# Patient Record
Sex: Female | Born: 1980 | Race: Black or African American | Hispanic: No | Marital: Single | Smoking: Current every day smoker
Health system: Southern US, Community
[De-identification: ages and names within clinical notes are randomized; demographics above are authoritative.]

---

## 2016-06-28 ENCOUNTER — Emergency Department (HOSPITAL_BASED_OUTPATIENT_CLINIC_OR_DEPARTMENT_OTHER): Payer: Medicaid Other

## 2016-06-28 ENCOUNTER — Encounter (HOSPITAL_BASED_OUTPATIENT_CLINIC_OR_DEPARTMENT_OTHER): Payer: Self-pay | Admitting: *Deleted

## 2016-06-28 ENCOUNTER — Emergency Department (HOSPITAL_BASED_OUTPATIENT_CLINIC_OR_DEPARTMENT_OTHER)
Admission: EM | Admit: 2016-06-28 | Discharge: 2016-06-28 | Disposition: A | Discharge status: Home / Self Care | Payer: Medicaid Other | Source: Home / Self Care | Attending: Emergency Medicine | Admitting: Emergency Medicine

## 2016-06-28 ENCOUNTER — Emergency Department (HOSPITAL_BASED_OUTPATIENT_CLINIC_OR_DEPARTMENT_OTHER)
Admission: EM | Admit: 2016-06-28 | Discharge: 2016-06-28 | Disposition: A | Discharge status: Home / Self Care | Payer: Medicaid Other | Attending: Emergency Medicine | Admitting: Emergency Medicine

## 2016-06-28 DIAGNOSIS — Z3A00 Weeks of gestation of pregnancy not specified: Secondary | ICD-10-CM

## 2016-06-28 DIAGNOSIS — R109 Unspecified abdominal pain: Secondary | ICD-10-CM | POA: Diagnosis not present

## 2016-06-28 DIAGNOSIS — O26899 Other specified pregnancy related conditions, unspecified trimester: Secondary | ICD-10-CM

## 2016-06-28 DIAGNOSIS — Z3A01 Less than 8 weeks gestation of pregnancy: Secondary | ICD-10-CM | POA: Insufficient documentation

## 2016-06-28 DIAGNOSIS — F17200 Nicotine dependence, unspecified, uncomplicated: Secondary | ICD-10-CM | POA: Insufficient documentation

## 2016-06-28 DIAGNOSIS — O23511 Infections of cervix in pregnancy, first trimester: Secondary | ICD-10-CM | POA: Insufficient documentation

## 2016-06-28 DIAGNOSIS — N72 Inflammatory disease of cervix uteri: Secondary | ICD-10-CM

## 2016-06-28 DIAGNOSIS — R102 Pelvic and perineal pain: Secondary | ICD-10-CM | POA: Insufficient documentation

## 2016-06-28 DIAGNOSIS — O23591 Infection of other part of genital tract in pregnancy, first trimester: Secondary | ICD-10-CM | POA: Insufficient documentation

## 2016-06-28 DIAGNOSIS — R197 Diarrhea, unspecified: Secondary | ICD-10-CM

## 2016-06-28 DIAGNOSIS — N760 Acute vaginitis: Secondary | ICD-10-CM

## 2016-06-28 DIAGNOSIS — B9689 Other specified bacterial agents as the cause of diseases classified elsewhere: Secondary | ICD-10-CM

## 2016-06-28 DIAGNOSIS — O26891 Other specified pregnancy related conditions, first trimester: Secondary | ICD-10-CM

## 2016-06-28 DIAGNOSIS — Z3490 Encounter for supervision of normal pregnancy, unspecified, unspecified trimester: Secondary | ICD-10-CM

## 2016-06-28 LAB — URINALYSIS, ROUTINE W REFLEX MICROSCOPIC
Bilirubin Urine: NEGATIVE
GLUCOSE, UA: NEGATIVE mg/dL
Hgb urine dipstick: NEGATIVE
Ketones, ur: NEGATIVE mg/dL
LEUKOCYTES UA: NEGATIVE
NITRITE: NEGATIVE
PROTEIN: NEGATIVE mg/dL
Specific Gravity, Urine: 1.022 (ref 1.005–1.030)
pH: 6 (ref 5.0–8.0)

## 2016-06-28 LAB — WET PREP, GENITAL
Sperm: NONE SEEN
Trich, Wet Prep: NONE SEEN
Yeast Wet Prep HPF POC: NONE SEEN

## 2016-06-28 LAB — ABO/RH: ABO/RH(D): A POS

## 2016-06-28 LAB — PREGNANCY, URINE: PREG TEST UR: POSITIVE — AB

## 2016-06-28 LAB — HCG, QUANTITATIVE, PREGNANCY: HCG, BETA CHAIN, QUANT, S: 9189 m[IU]/mL — AB (ref <5)

## 2016-06-28 MED ORDER — AZITHROMYCIN 250 MG PO TABS
1000.0000 mg | ORAL_TABLET | Freq: Once | ORAL | Status: AC
  Administered 2016-06-28: 1000 mg via ORAL
  Filled 2016-06-28: qty 4

## 2016-06-28 MED ORDER — CEFTRIAXONE SODIUM 250 MG IJ SOLR
250.0000 mg | Freq: Once | INTRAMUSCULAR | Status: DC
  Filled 2016-06-28: qty 250

## 2016-06-28 MED ORDER — LIDOCAINE HCL (PF) 1 % IJ SOLN
INTRAMUSCULAR | Status: AC
  Filled 2016-06-28: qty 5

## 2016-06-28 MED ORDER — CEFTRIAXONE SODIUM 250 MG IJ SOLR
250.0000 mg | Freq: Once | INTRAMUSCULAR | Status: AC
  Administered 2016-06-28: 250 mg via INTRAMUSCULAR
  Filled 2016-06-28: qty 250

## 2016-06-28 MED ORDER — AZITHROMYCIN 250 MG PO TABS
1000.0000 mg | ORAL_TABLET | Freq: Once | ORAL | Status: DC
  Filled 2016-06-28: qty 4

## 2016-06-28 MED ORDER — LIDOCAINE HCL (PF) 1 % IJ SOLN
INTRAMUSCULAR | Status: AC
  Administered 2016-06-28: 2.1 mL
  Filled 2016-06-28: qty 5

## 2016-06-28 MED ORDER — METRONIDAZOLE 0.75 % VA GEL: 1.0000 | Freq: Two times a day (BID) | VAGINAL | 0 refills | Status: AC

## 2016-06-28 NOTE — ED Triage Notes (Signed)
States she just left due to having no one to pick up her child. She wants to continue her care.

## 2016-06-28 NOTE — ED Provider Notes (Signed)
[facility] Provider Note   CSN: [id number] Arrival date & time: 06/28/16  1824  By signing my name below, I, Katherine Clayton, attest that this documentation has been prepared under the direction and in the presence of Fayrene Helper PA-C.  Electronically Signed: Vista Clayton, ED Scribe. 06/28/16. 7:01 PM.   History   Chief Complaint Chief Complaint  Patient presents with  . Vaginal Bleeding    HPI HPI Comments: Katherine Clayton is a 35 y.o. Female who is G4P2A2 who presents to the Emergency Department complaining of intermittent cramping abdominal pain, onset one week ago. Pt started having vaginal discharge three weeks ago and was seen by her PCP who dx her with bacterial vaginosis and a yeast infection, treated with Flagyl and Fluconazole. These symptoms subsided with treatment. Two weeks ago, she was supposed to start her menstrual period but never started. Pt then took an at home pregnancy test a few days after and was positive. This is her 4th pregnancy, has two children currently. One week after taking the pregnancy test, she started to have the abdominal pain. She also reports associated vaginal bleeding described as "like I'm spotting". Pt noticed this intermittently on toilet tissue when wiping. She also reports one episode of diarrhea yesterday, no hematochezia. Pt states that she has had 2 different sexual partners in the past 6 months. She denies any modifying factors to her abdominal pain. Pt denies dysuria, hematuria, fever, chills, nausea, vomiting.   The history is provided by the patient. No language interpreter was used.    History reviewed. No pertinent past medical history.  There are no active problems to display for this patient.   History reviewed. No pertinent surgical history.  OB History    No data available       Home Medications    Prior to Admission medications   Not on File    Family History No family history on file.  Social  History Social History  Substance Use Topics  . Smoking status: Current Every Day Smoker  . Smokeless tobacco: Never Used  . Alcohol use Yes     Allergies   Review of patient's allergies indicates no known allergies.   Review of Systems Review of Systems  Constitutional: Negative for chills and fever.  Gastrointestinal: Positive for abdominal pain (lower) and diarrhea (one episode). Negative for nausea and vomiting.  Genitourinary: Positive for vaginal bleeding. Negative for dysuria and hematuria.  All other systems reviewed and are negative.    Physical Exam Updated Vital Signs BP 115/66 (BP Location: Left Arm)   Pulse 66   Temp 98.1 F (36.7 C) (Oral)   Resp 16   Ht 5\' 9"  (1.753 m)   Wt 140 lb (63.5 kg)   LMP 05/13/2016   SpO2 100%   BMI 20.67 kg/m   Physical Exam  Constitutional: She is oriented to person, place, and time. She appears well-developed and well-nourished. No distress.  HENT:  Head: Normocephalic and atraumatic.  Neck: Normal range of motion.  Cardiovascular: Normal rate and regular rhythm.   Pulmonary/Chest: Effort normal and breath sounds normal. She has no wheezes.  Abdominal: There is tenderness. There is no rebound and no guarding.  Mild tenderness to left lower abdomen on palpation. No guarding or rebound on palpation.  Genitourinary:  Genitourinary Comments: Chaperone present during exam.  No inguinal lymphadenopathy or inguinal hernia noted.  Normal external genitalia.  Mild discomfort with speculum insertion.  Mild yellow discharge noted in vaginal vault.  Closed cervical os without lesion or rash.  On bimanual exam R adnexal tenderness without CMT.  No mass.    Neurological: She is alert and oriented to person, place, and time.  Skin: Skin is warm and dry. She is not diaphoretic.  Psychiatric: She has a normal mood and affect. Judgment normal.  Nursing note and vitals reviewed.   ED Treatments / Results  DIAGNOSTIC STUDIES: Oxygen  Saturation is 100% on RA, normal by my interpretation.  COORDINATION OF CARE: 6:55 PM-Will do pelvic exam. Discussed treatment plan with pt at bedside and pt agreed to plan.   Labs (all labs ordered are listed, but only abnormal results are displayed) Labs Reviewed  WET PREP, GENITAL - Abnormal; Notable for the following:       Result Value   Clue Cells Wet Prep HPF POC PRESENT (*)    WBC, Wet Prep HPF POC MANY (*)    All other components within normal limits  PREGNANCY, URINE - Abnormal; Notable for the following:    Preg Test, Ur POSITIVE (*)    All other components within normal limits  URINALYSIS, ROUTINE W REFLEX MICROSCOPIC (NOT AT [facility])  HCG, QUANTITATIVE, PREGNANCY  HIV ANTIBODY (ROUTINE TESTING)  RPR  ABO/RH  GC/CHLAMYDIA PROBE AMP ([place]) NOT AT [facility]    EKG  EKG Interpretation None       Radiology No results found.  Procedures Procedures (including critical care time)  Medications Ordered in ED Medications  cefTRIAXone (ROCEPHIN) injection 250 mg (not administered)  azithromycin (ZITHROMAX) tablet 1,000 mg (not administered)     Initial Impression / Assessment and Plan / ED Course  I have reviewed the triage vital signs and the nursing notes.  Pertinent labs & imaging results that were available during my care of the patient were reviewed by me and considered in my medical decision making (see chart for details).  Clinical Course    BP 115/66 (BP Location: Left Arm)   Pulse 66   Temp 98.1 F (36.7 C) (Oral)   Resp 16   Ht 5\' 9"  (1.753 m)   Wt 63.5 kg   LMP 05/13/2016   SpO2 100%   BMI 20.67 kg/m    Final Clinical Impressions(s) / ED Diagnoses   Final diagnoses:  Right adnexal tenderness  Pelvic pain in female    New Prescriptions New Prescriptions   No medications on file  I personally performed the services described in this documentation, which was scribed in my presence. The recorded information has been reviewed and is  accurate.     7:14 PM Pt here with lower pelvic pain, positive pregnancy test.  Does report mild vaginal bleeding and pain.  No visible bleeding noted on exam.  Does have vaginal discharge, therefore will treat for potential STD.  Will obtain transvaginal US to r/o ectopic, TOA, or torsion.  Will check ABO/Rh as well as Hcg Quant.  Pain medication offered, pt declined.  Pt is well appearing.    7:57 PM Wet prep shows clue cells and many WBC.  Will treat for cervicitis with rocephin/zithromax  Nurse informed me that pt have left AMA because she has to pick up her kid.  She understand that she can return for further care.     Fayrene HelperBowie Peggy Loge, PA-C 06/28/16 2000    Rolan BuccoMelanie Belfi, MD 06/28/16 2024

## 2016-06-28 NOTE — ED Triage Notes (Signed)
Vaginal bleeding. States she had a positive home pregnancy test. She thinks she has a vaginal infection.

## 2016-06-28 NOTE — ED Provider Notes (Signed)
[facility]   CSN: [id number] Arrival date & time: 06/28/16  2009  By signing my name below, I, Katherine Clayton, attest that this documentation has been prepared under the direction and in the presence of Katherine Clayton Katherine Biller PA-C. Electronically Signed: Vista Minkobert Clayton, ED Scribe. 06/28/16. 9:00 PM.  Charting resumed from Katherine Clayton, now Katherine Clayton at 9:29 PM.   By signing my name below, I, Katherine AdieWilliam Andrew Clayton, attest that this documentation has been prepared under the direction and in the presence of Katherine Clayton Blasa Raisch, PA-C.  Electronically Signed: Rosario AdieWilliam Andrew Clayton, ED Scribe. 06/28/16. 9:29 PM.  History   Chief Complaint Chief Complaint  Patient presents with  . Abdominal Pain   HPI HPI Comments: Katherine Clayton is a 35 y.o. Female who is G4P2A2 who presents to the Emergency Department complaining of intermittent cramping abdominal pain, onset one week ago. Pt started having vaginal discharge three weeks ago and was seen by her PCP who dx her with bacterial vaginosis and a yeast infection, treated with Flagyl and Fluconazole. These symptoms subsided with treatment. Two weeks ago, she was supposed to start her menstrual period but reports that it never started. Pt then took an at home pregnancy test a few days after and was positive. This is her 4th pregnancy, has two children currently. One week after taking the pregnancy test, the abdominal pain started. She also reports associated vaginal bleeding described as "like I'm spotting". Pt noticed this intermittently, on toilet tissue when wiping. She also reports one episode of diarrhea yesterday, no hematochezia. Pt states that she has had 2 different sexual partners in the past 6 months. She denies any modifying factors to her abdominal pain. Pt denies dysuria, hematuria, fever, chills, nausea, vomiting.   The history is provided by the patient. No language interpreter was used.    History reviewed. No pertinent past  medical history.  There are no active problems to display for this patient.   History reviewed. No pertinent surgical history.  OB History    No data available       Home Medications    Prior to Admission medications   Not on File    Family History No family history on file.  Social History Social History  Substance Use Topics  . Smoking status: Current Every Day Smoker  . Smokeless tobacco: Never Used  . Alcohol use Yes     Allergies   Review of patient's allergies indicates no known allergies.   Review of Systems Review of Systems   Physical Exam Updated Vital Signs BP 111/67 (BP Location: Left Arm)   Pulse 79   Temp 98.1 F (36.7 C) (Oral)   Resp 16   LMP 05/13/2016   SpO2 100%   Physical Exam  Constitutional: She is oriented to person, place, and time. She appears well-developed and well-nourished. No distress.  HENT:  Head: Normocephalic and atraumatic.  Neck: Normal range of motion.  Pulmonary/Chest: Effort normal.  Abdominal: Soft. Bowel sounds are normal. There is tenderness. There is no rebound and no guarding.  Mild tenderness to left lower abdomen on palpation. No guarding or rebound on palpation.   Genitourinary:  Genitourinary Comments: Chaperone present during exam.  No inguinal lymphadenopathy or inguinal hernia noted.  Normal external genitalia.  Mild discomfort with speculum insertion.  Mild yellow discharge noted in vaginal vault.  Closed cervical os without lesion or rash.  On bimanual exam R adnexal tenderness without CMT.  No mass.  Neurological: She is alert and oriented to person, place, and time.  Skin: Skin is warm and dry. She is not diaphoretic.  Psychiatric: She has a normal mood and affect. Judgment normal.  Nursing note and vitals reviewed.  ED Treatments / Results  DIAGNOSTIC STUDIES: Oxygen Saturation is 100% on RA, normal by my interpretation.  COORDINATION OF CARE: 8:37 PM-Discussed treatment plan with pt at  bedside and pt agreed to plan.   Labs (all labs ordered are listed, but only abnormal results are displayed) Labs Reviewed - No data to display  EKG  EKG Interpretation None       Radiology US Ob Comp Less 14 Wks  Result Date: 06/28/2016 CLINICAL DATA:  Acute onset of right adnexal tenderness and pelvic cramping. Initial encounter. EXAM: OBSTETRIC <14 WK [place] AND TRANSVAGINAL OB US TECHNIQUE: Both transabdominal and transvaginal ultrasound examinations were performed for complete evaluation of the gestation as well as the maternal uterus, adnexal regions, and pelvic cul-de-sac. Transvaginal technique was performed to assess early pregnancy. COMPARISON:  None. FINDINGS: Intrauterine gestational sac: Single; visualized and normal in shape. Yolk sac:  Yes Embryo:  No Cardiac Activity: N/A MSD: 8.6  mm   5 w   5  d Subchorionic hemorrhage:  None visualized. Maternal uterus/adnexae: The uterus is unremarkable in appearance. The ovaries are within normal limits. The right ovary measures 3.5 x 2.7 x 2.9 cm, while the left ovary measures 3.4 x 1.8 x 1.9 cm. No suspicious adnexal masses are seen; there is no evidence for ovarian torsion. No free fluid is seen within the pelvic cul-de-sac. IMPRESSION: Single intrauterine gestational sac noted, with a mean sac diameter of 9 mm, corresponding to a gestational age of [redacted] weeks 5 days. This matches the gestational age of [redacted] weeks 1 day by LMP, and reflects an estimated date of delivery of Feb 20, 2017. A yolk sac is visualized. No embryo is yet seen. Electronically Signed   By: [name] M.D.   On: 06/28/2016 22:10   [place] Art/ven Flow Abd Pelv Doppler  Result Date: 06/28/2016 CLINICAL DATA:  Acute onset of right adnexal tenderness and pelvic cramping. Initial encounter. EXAM: OBSTETRIC <14 WK [place] AND TRANSVAGINAL OB US TECHNIQUE: Both transabdominal and transvaginal ultrasound examinations were performed for complete evaluation of the gestation as well as the  maternal uterus, adnexal regions, and pelvic cul-de-sac. Transvaginal technique was performed to assess early pregnancy. COMPARISON:  None. FINDINGS: Intrauterine gestational sac: Single; visualized and normal in shape. Yolk sac:  Yes Embryo:  No Cardiac Activity: N/A MSD: 8.6  mm   5 w   5  d Subchorionic hemorrhage:  None visualized. Maternal uterus/adnexae: The uterus is unremarkable in appearance. The ovaries are within normal limits. The right ovary measures 3.5 x 2.7 x 2.9 cm, while the left ovary measures 3.4 x 1.8 x 1.9 cm. No suspicious adnexal masses are seen; there is no evidence for ovarian torsion. No free fluid is seen within the pelvic cul-de-sac. IMPRESSION: Single intrauterine gestational sac noted, with a mean sac diameter of 9 mm, corresponding to a gestational age of [redacted] weeks 5 days. This matches the gestational age of [redacted] weeks 1 day by LMP, and reflects an estimated date of delivery of Feb 20, 2017. A yolk sac is visualized. No embryo is yet seen. Electronically Signed   By: [name] M.D.   On: 06/28/2016 22:10    Procedures Procedures (including critical care time)  Medications Ordered in ED Medications  cefTRIAXone (ROCEPHIN) injection 250 mg (250 mg Intramuscular Given 06/28/16 2225)  azithromycin (ZITHROMAX) tablet 1,000 mg (1,000 mg Oral Given 06/28/16 2223)  lidocaine (PF) (XYLOCAINE) 1 % injection (2.1 mLs  Given 06/28/16 2225)     Initial Impression / Assessment and Plan / ED Course  I have reviewed the triage vital signs and the nursing notes.  Pertinent labs & imaging results that were available during my care of the patient were reviewed by me and considered in my medical decision making (see chart for details).  Clinical Course    BP 111/67 (BP Location: Left Arm)   Pulse 79   Temp 98.1 F (36.7 C) (Oral)   Resp 16   LMP 05/13/2016   SpO2 100%    Final Clinical Impressions(s) / ED Diagnoses   Final diagnoses:  Pregnancy    New Prescriptions New  Prescriptions   METRONIDAZOLE (METROGEL VAGINAL) 0.75 % VAGINAL GEL    Place 1 Applicatorful vaginally 2 (two) times daily.  I personally performed the services described in this documentation, which was scribed in my presence. The recorded information has been reviewed and is accurate.      7:14 PM Pt here with lower pelvic pain, positive pregnancy test.  Does report mild vaginal bleeding and pain.  No visible bleeding noted on exam.  Does have vaginal discharge, therefore will treat for potential STD.  Will obtain transvaginal US to r/o ectopic, TOA, or torsion.  Will check ABO/Rh as well as Hcg Quant.  Pain medication offered, pt declined.  Pt is well appearing.    7:57 PM Wet prep shows clue cells and many WBC.  Will treat for cervicitis with rocephin/zithromax  Nurse informed me that pt have left AMA because she has to pick up her kid.  She understand that she can return for further care.    10:41 PM Pt return for the remainder of her examination.  Pelvis US demonstrates IUP estimated to be [redacted]w[redacted]d.  [place] sac present, embryo not visualized yet.  No other concerning features.   Pt have received rocephin/zithromax for cervicitis.  Will d/c with metrogel for BV, she will need to f/u closely with [facility] for further management of her pregnancy.     Katherine Helper, PA-C 06/28/16 2254    Rolan Bucco, MD 06/28/16 [phone]

## 2016-06-28 NOTE — ED Notes (Signed)
Patient transported to Ultrasound 

## 2016-06-28 NOTE — ED Notes (Signed)
Came into room to give abx and pt is getting dressed stating that she cannot find someone to pick up her child and she needs to leave. States she will come back later. Asked to stay for abx and assess capability for outpatient US with [facility], pt refuses to stay any longer, receive abx or vitals but states she will be coming back later. Signed AMA. Ambulated to exit with steady gait in NAD with her small child.

## 2016-06-28 NOTE — Discharge Instructions (Signed)
You are 5 weeks and 5 days pregnant.  [facility] with [facility] for further management of your pregnancy.  Use metrogel cream twice daily for 7 days.  Return if you have any concerns.

## 2016-06-28 NOTE — ED Notes (Signed)
Pt verbalizes understanding of d/c instructions and denies any further needs at this time. 

## 2016-06-29 LAB — GC/CHLAMYDIA PROBE AMP (~~LOC~~) NOT AT ARMC
CHLAMYDIA, DNA PROBE: NEGATIVE
NEISSERIA GONORRHEA: NEGATIVE

## 2016-06-30 LAB — HIV ANTIBODY (ROUTINE TESTING W REFLEX): HIV SCREEN 4TH GENERATION: NONREACTIVE

## 2016-06-30 LAB — RPR: RPR: NONREACTIVE

## 2016-07-12 ENCOUNTER — Emergency Department (HOSPITAL_BASED_OUTPATIENT_CLINIC_OR_DEPARTMENT_OTHER)
Admission: EM | Admit: 2016-07-12 | Discharge: 2016-07-12 | Disposition: A | Discharge status: Home / Self Care | Payer: 59 | Attending: Emergency Medicine | Admitting: Emergency Medicine

## 2016-07-12 ENCOUNTER — Encounter (HOSPITAL_BASED_OUTPATIENT_CLINIC_OR_DEPARTMENT_OTHER): Payer: Self-pay | Admitting: Emergency Medicine

## 2016-07-12 DIAGNOSIS — N760 Acute vaginitis: Secondary | ICD-10-CM

## 2016-07-12 DIAGNOSIS — Z3A01 Less than 8 weeks gestation of pregnancy: Secondary | ICD-10-CM | POA: Diagnosis not present

## 2016-07-12 DIAGNOSIS — F17200 Nicotine dependence, unspecified, uncomplicated: Secondary | ICD-10-CM | POA: Diagnosis not present

## 2016-07-12 DIAGNOSIS — O9989 Other specified diseases and conditions complicating pregnancy, childbirth and the puerperium: Secondary | ICD-10-CM | POA: Insufficient documentation

## 2016-07-12 DIAGNOSIS — B9689 Other specified bacterial agents as the cause of diseases classified elsewhere: Secondary | ICD-10-CM

## 2016-07-12 DIAGNOSIS — O23591 Infection of other part of genital tract in pregnancy, first trimester: Secondary | ICD-10-CM | POA: Diagnosis not present

## 2016-07-12 DIAGNOSIS — Z792 Long term (current) use of antibiotics: Secondary | ICD-10-CM | POA: Diagnosis not present

## 2016-07-12 DIAGNOSIS — M545 Low back pain: Secondary | ICD-10-CM | POA: Insufficient documentation

## 2016-07-12 DIAGNOSIS — Z3490 Encounter for supervision of normal pregnancy, unspecified, unspecified trimester: Secondary | ICD-10-CM

## 2016-07-12 LAB — WET PREP, GENITAL
SPERM: NONE SEEN
Trich, Wet Prep: NONE SEEN
YEAST WET PREP: NONE SEEN

## 2016-07-12 LAB — URINALYSIS, ROUTINE W REFLEX MICROSCOPIC
Bilirubin Urine: NEGATIVE
GLUCOSE, UA: NEGATIVE mg/dL
Hgb urine dipstick: NEGATIVE
KETONES UR: NEGATIVE mg/dL
LEUKOCYTES UA: NEGATIVE
NITRITE: NEGATIVE
PH: 7 (ref 5.0–8.0)
PROTEIN: NEGATIVE mg/dL
Specific Gravity, Urine: 1.024 (ref 1.005–1.030)

## 2016-07-12 MED ORDER — CYCLOBENZAPRINE HCL 5 MG PO TABS: 5.0000 mg | ORAL_TABLET | Freq: Two times a day (BID) | ORAL | 0 refills | Status: AC | PRN

## 2016-07-12 MED ORDER — METRONIDAZOLE 500 MG PO TABS: 500.0000 mg | ORAL_TABLET | Freq: Two times a day (BID) | ORAL | 0 refills | Status: AC

## 2016-07-12 NOTE — ED Triage Notes (Signed)
Patient reports that she has had Back pain x 2 weeks. The patient reports that it hurts with movement. Patient reports today is the worst

## 2016-07-12 NOTE — ED Provider Notes (Signed)
[facility]   CSN: [id number] Arrival date & time: 07/12/16  1620     History   Chief Complaint Chief Complaint  Patient presents with  . Back Pain    HPI Katherine Clayton is a 35 y.o. female.  HPI Patient's had low back pain for the last 2 weeks. States that it is worse with movement. States worse in the right side but is on both sides. Is in her lower back. Diagnosed with a 5 week intrauterine pregnancy 2 weeks ago while in the ER. States the pain started around 2 days after being seen in the ER. No vaginal bleeding. No real abdominal pain. Pain is worse with movements. She is not taking anything for it. No dysuria. States she could have a yeast infection now because she is finishing up the treatment for her bacterial vaginosis. No abdominal pain.  History reviewed. No pertinent past medical history.  There are no active problems to display for this patient.   History reviewed. No pertinent surgical history.  OB History    No data available       Home Medications    Prior to Admission medications   Medication Sig Start Date End Date Taking? Authorizing Provider  clindamycin (CLEOCIN) 300 MG capsule Take 300 mg by mouth 3 (three) times daily.   Yes Historical Provider, MD  cyclobenzaprine (FLEXERIL) 5 MG tablet Take 1-2 tablets (5-10 mg total) by mouth 2 (two) times daily as needed for muscle spasms. 07/12/16   Benjiman Core, MD  metroNIDAZOLE (FLAGYL) 500 MG tablet Take 1 tablet (500 mg total) by mouth 2 (two) times daily. 07/12/16   Benjiman Core, MD  metroNIDAZOLE (METROGEL VAGINAL) 0.75 % vaginal gel Place 1 Applicatorful vaginally 2 (two) times daily. 06/28/16   Fayrene Helper, PA-C    Family History History reviewed. No pertinent family history.  Social History Social History  Substance Use Topics  . Smoking status: Current Every Day Smoker  . Smokeless tobacco: Never Used  . Alcohol use Yes     Allergies   Review of patient's  allergies indicates no known allergies.   Review of Systems Review of Systems  Constitutional: Negative for activity change and appetite change.  Respiratory: Negative for shortness of breath.   Cardiovascular: Negative for chest pain.  Gastrointestinal: Negative for abdominal pain.  Genitourinary: Negative for pelvic pain, vaginal bleeding and vaginal discharge.  Musculoskeletal: Positive for back pain.  Skin: Negative for rash.  Neurological: Negative for light-headedness.  Hematological: Negative for adenopathy.     Physical Exam Updated Vital Signs BP 114/60 (BP Location: Right Arm)   Pulse 73   Temp 98.3 F (36.8 C) (Oral)   Resp 18   Ht 5\' 9"  (1.753 m)   Wt 145 lb (65.8 kg)   LMP 05/13/2016   SpO2 100%   BMI 21.41 kg/m   Physical Exam  Constitutional: She appears well-developed.  HENT:  Head: Atraumatic.  Eyes: EOM are normal.  Cardiovascular: Normal rate.   Pulmonary/Chest: Effort normal.  Abdominal: Soft. There is no tenderness.  Musculoskeletal:  Mild lumbar tenderness. Pain with straight leg raise bilaterally, worse on the right side. Reflexes intact. Good strength in lower extremities.  Skin: Capillary refill takes less than 2 seconds.  Psychiatric: She has a normal mood and affect.  Pelvic exam with some white discharge. Adherent to the walls. No cervical motion tenderness. Os closed.    ED Treatments / Results  Labs (all labs ordered are listed, but  only abnormal results are displayed) Labs Reviewed  WET PREP, GENITAL - Abnormal; Notable for the following:       Result Value   Clue Cells Wet Prep HPF POC PRESENT (*)    WBC, Wet Prep HPF POC MODERATE (*)    All other components within normal limits  URINALYSIS, ROUTINE W REFLEX MICROSCOPIC (NOT AT [facility]) - Abnormal; Notable for the following:    APPearance CLOUDY (*)    All other components within normal limits    EKG  EKG Interpretation None       Radiology No results  found.  Procedures Procedures (including critical care time)  Medications Ordered in ED Medications - No data to display   Initial Impression / Assessment and Plan / ED Course  I have reviewed the triage vital signs and the nursing notes.  Pertinent labs & imaging results that were available during my care of the patient were reviewed by me and considered in my medical decision making (see chart for details).  Clinical Course    Patient with likely muscle skeletal back pain. Around [redacted] weeks pregnant. Doubt this is related to any uterine pathology. Also has continued BV. We'll treat with oral Flagyl. Will discharge home. Will give Flexeril for the back pain.  Final diagnoses:  Low back pain without sciatica, unspecified back pain laterality  Bacterial vaginosis  Pregnant    New Prescriptions Discharge Medication List as of 07/12/2016  8:18 PM    START taking these medications   Details  cyclobenzaprine (FLEXERIL) 5 MG tablet Take 1-2 tablets (5-10 mg total) by mouth 2 (two) times daily as needed for muscle spasms., Starting Thu 07/12/2016, Print    metroNIDAZOLE (FLAGYL) 500 MG tablet Take 1 tablet (500 mg total) by mouth 2 (two) times daily., Starting Thu 07/12/2016, Print         Benjiman CoreNathan Marlissa Emerick, MD 07/12/16 2322

## 2017-12-01 IMAGING — US US ART/VEN ABD/PELV/SCROTUM DOPPLER LTD
1 series · 13 of 21 positions shown · non-contrast
Comparison: None.

CLINICAL DATA: Acute onset of right adnexal tenderness and pelvic
cramping. Initial encounter.

EXAM:
OBSTETRIC <14 WK US AND TRANSVAGINAL OB US
TECHNIQUE: Both transabdominal and transvaginal ultrasound examinations were
performed for complete evaluation of the gestation as well as the
maternal uterus, adnexal regions, and pelvic cul-de-sac.
Transvaginal technique was performed to assess early pregnancy.

[Series 1: us art/ven abd/pelv/scrotum doppler ltd · 0.11mm/px · 13 of 21 slices shown]
[im 1/21]
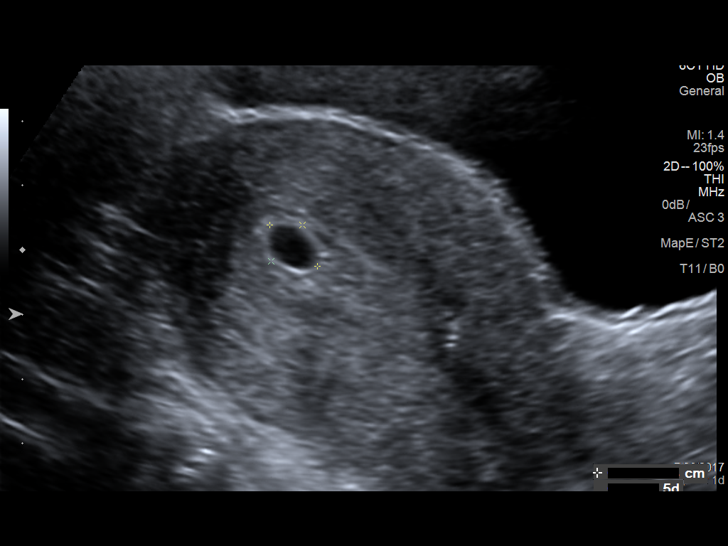
[im 3/21]
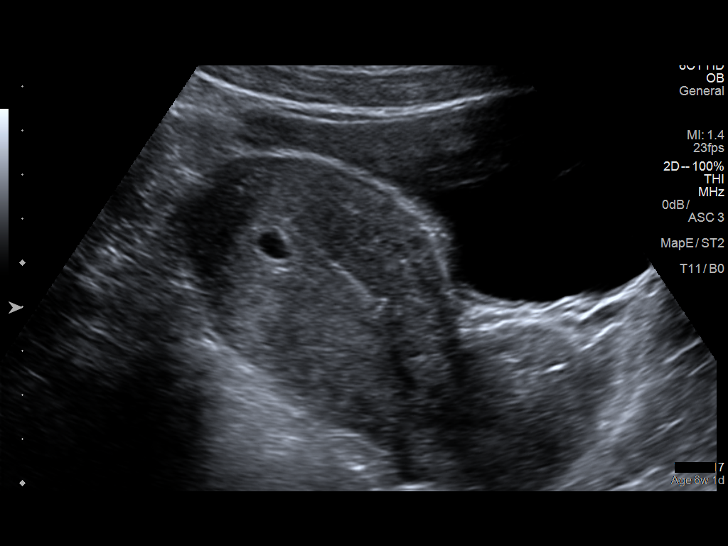
[im 5/21]
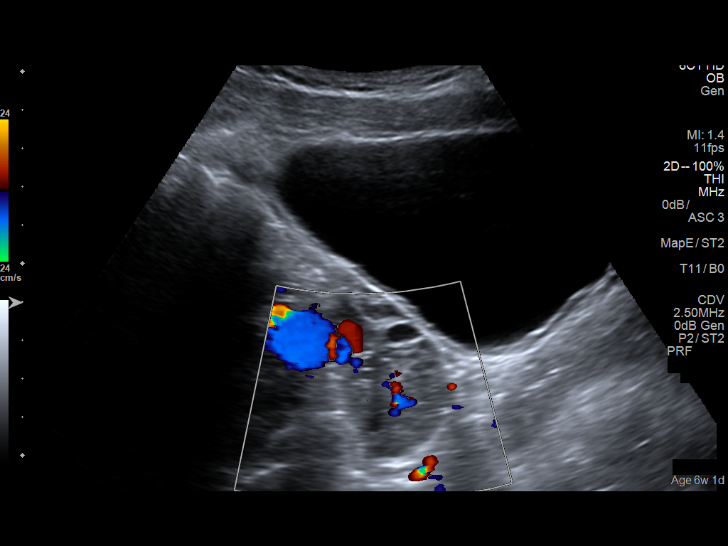
[im 6/21]
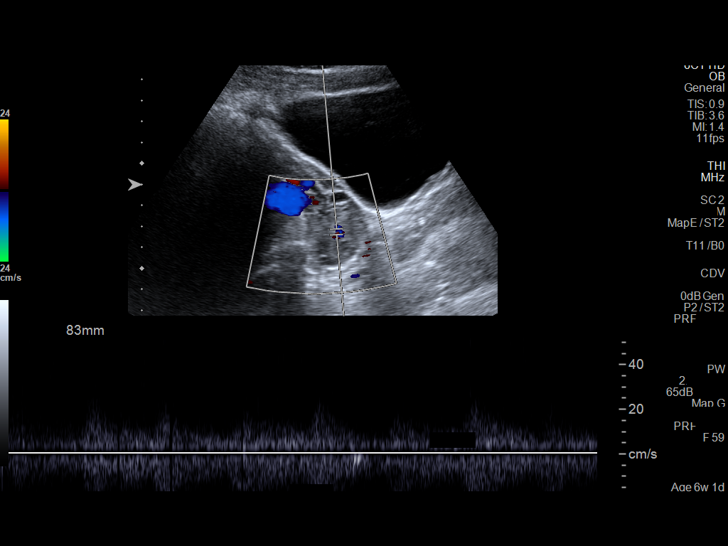
[im 8/21]
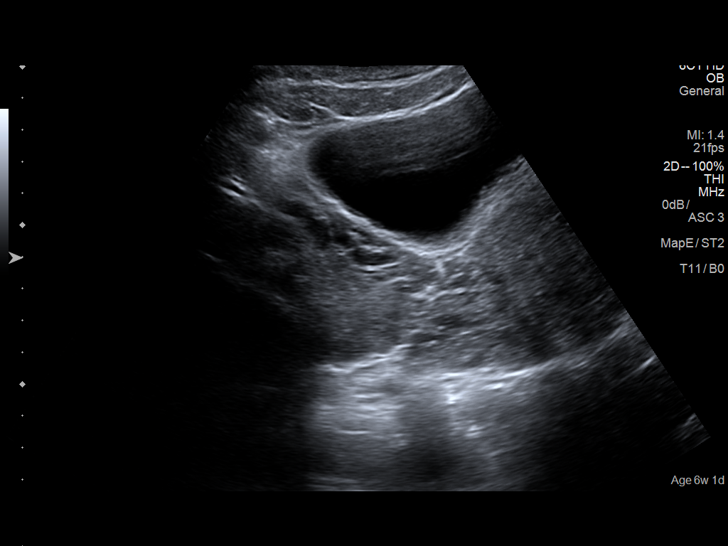
[im 9/21]
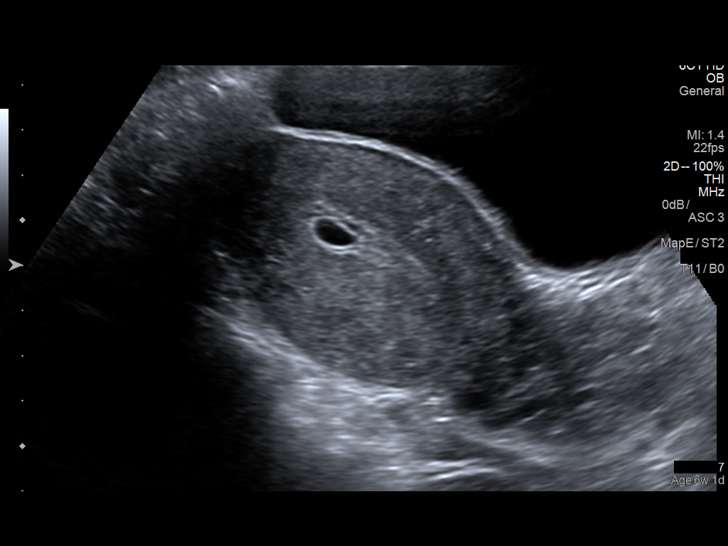
[im 11/21]
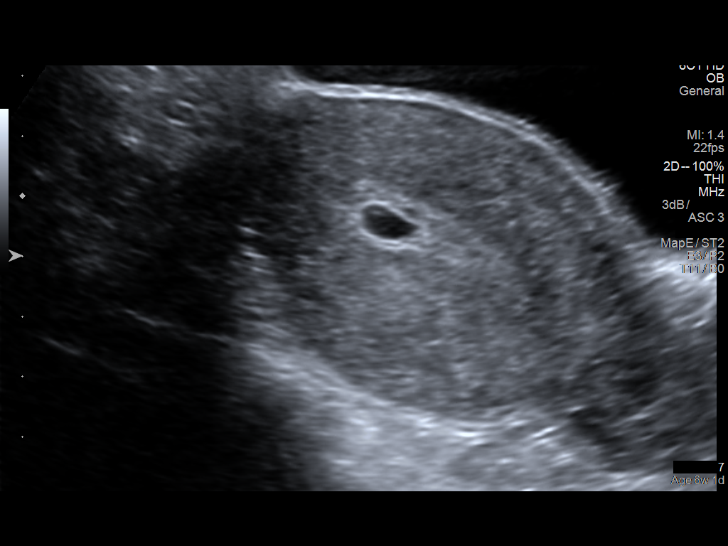
[im 13/21]
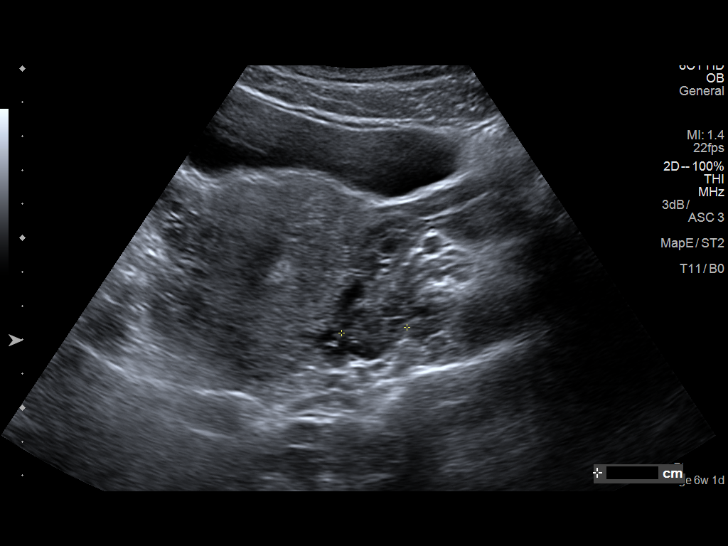
[im 14/21]
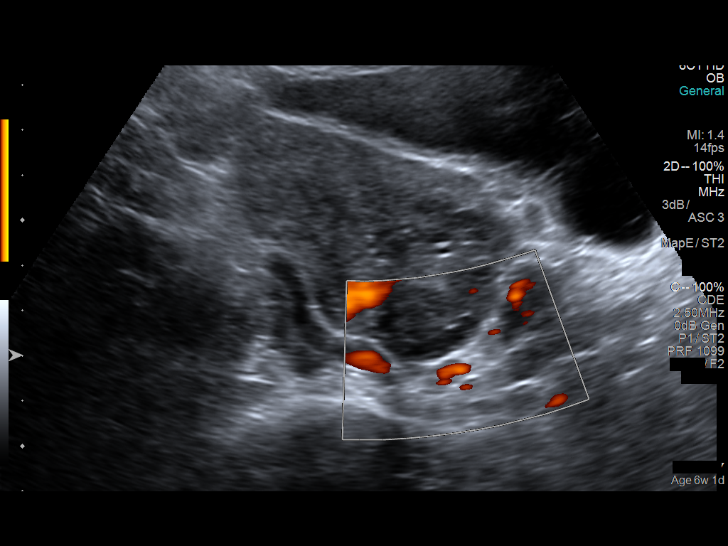
[im 16/21]
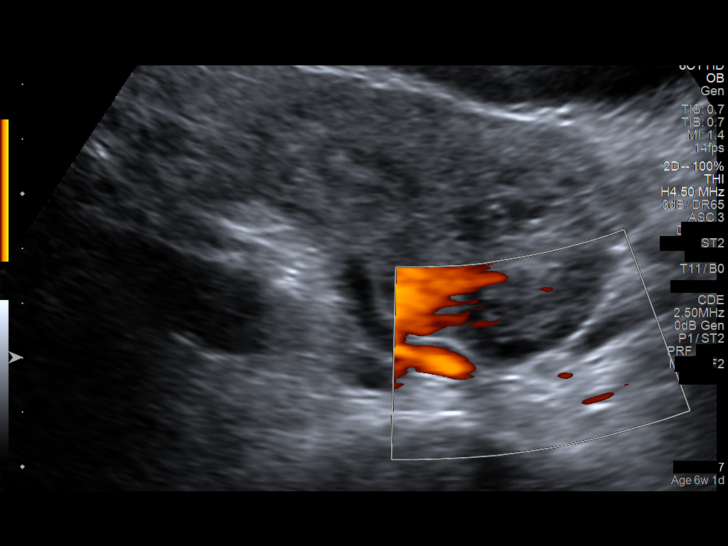
[im 17/21]
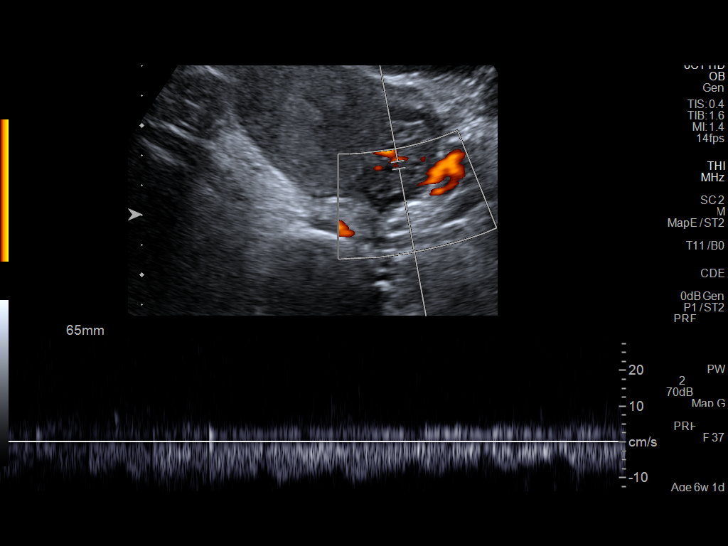
[im 19/21]
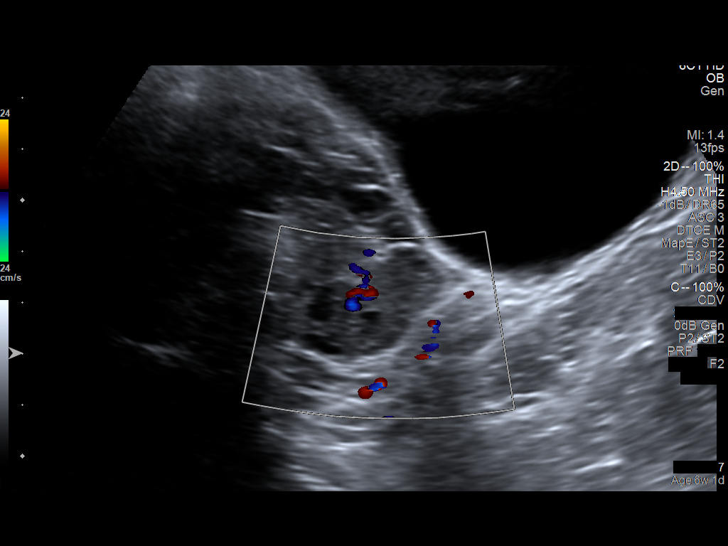
[im 21/21]
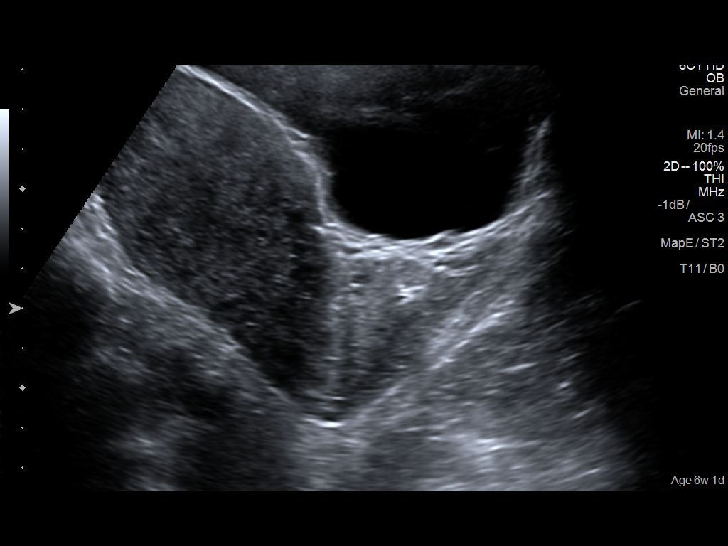

[13 of 21 positions shown; findings below may reference images not displayed]

FINDINGS: Intrauterine gestational sac: Single; visualized and normal in
shape.

Yolk sac:  Yes

Embryo:  No

Cardiac Activity: N/A

MSD: 8.6  mm   5 w   5  d

Subchorionic hemorrhage:  None visualized.

Maternal uterus/adnexae: The uterus is unremarkable in appearance.

The ovaries are within normal limits. The right ovary measures 3.5 x
2.7 x 2.9 cm, while the left ovary measures 3.4 x 1.8 x 1.9 cm. No
suspicious adnexal masses are seen; there is no evidence for ovarian
torsion.

No free fluid is seen within the pelvic cul-de-sac.
IMPRESSION: Single intrauterine gestational sac noted, with a mean sac diameter
of 9 mm, corresponding to a gestational age of 5 weeks 5 days. This
matches the gestational age of 6 weeks 1 day by LMP, and reflects an
estimated date of delivery February 20, 2017. A yolk sac is visualized.
No embryo is yet seen.
